# Patient Record
Sex: Male | Born: 1985 | Race: White | Hispanic: No | Marital: Single
Health system: Southern US, Community
[De-identification: ages and names within clinical notes are randomized; demographics above are authoritative.]

---

## 2013-02-28 ENCOUNTER — Emergency Department: Payer: Self-pay

## 2013-02-28 LAB — CBC
HGB: 16.2 g/dL (ref 13.0–18.0)
MCH: 32.1 pg (ref 26.0–34.0)
MCHC: 35 g/dL (ref 32.0–36.0)
MCV: 92 fL (ref 80–100)

## 2013-02-28 LAB — URINALYSIS, COMPLETE
Bilirubin,UR: NEGATIVE
Blood: NEGATIVE
Ketone: NEGATIVE
Nitrite: NEGATIVE
Protein: NEGATIVE
Specific Gravity: 1.024 (ref 1.003–1.030)
Squamous Epithelial: <1
WBC UR: 25 /HPF (ref 0–5)

## 2013-02-28 LAB — DRUG SCREEN, URINE
Amphetamines, Ur Screen: NEGATIVE (ref ?–1000)
Barbiturates, Ur Screen: NEGATIVE (ref ?–200)
Cannabinoid 50 Ng, Ur ~~LOC~~: POSITIVE (ref ?–50)
Cocaine Metabolite,Ur ~~LOC~~: POSITIVE (ref ?–300)
Methadone, Ur Screen: NEGATIVE (ref ?–300)
Opiate, Ur Screen: NEGATIVE (ref ?–300)
Phencyclidine (PCP) Ur S: NEGATIVE (ref ?–25)

## 2013-02-28 LAB — COMPREHENSIVE METABOLIC PANEL
Alkaline Phosphatase: 80 U/L (ref 50–136)
Bilirubin,Total: 0.7 mg/dL (ref 0.2–1.0)
Calcium, Total: 9.3 mg/dL (ref 8.5–10.1)
Chloride: 105 mmol/L (ref 98–107)
Creatinine: 1.12 mg/dL (ref 0.60–1.30)
EGFR (African American): >60
Glucose: 130 mg/dL — ABNORMAL HIGH (ref 65–99)
Potassium: 3.4 mmol/L — ABNORMAL LOW (ref 3.5–5.1)
SGOT(AST): 29 U/L (ref 15–37)
Sodium: 140 mmol/L (ref 136–145)

## 2013-02-28 LAB — ETHANOL: Ethanol: <3 mg/dL

## 2013-03-03 ENCOUNTER — Emergency Department: Payer: Self-pay | Admitting: Emergency Medicine

## 2013-03-11 ENCOUNTER — Emergency Department: Payer: Self-pay | Admitting: Emergency Medicine

## 2014-05-15 ENCOUNTER — Emergency Department: Payer: Self-pay | Admitting: Emergency Medicine

## 2014-05-15 LAB — COMPREHENSIVE METABOLIC PANEL
ALK PHOS: 83 U/L
ALT: 20 U/L
ANION GAP: 12 (ref 7–16)
Albumin: 3.9 g/dL (ref 3.4–5.0)
BUN: 5 mg/dL — ABNORMAL LOW (ref 7–18)
Bilirubin,Total: 0.5 mg/dL (ref 0.2–1.0)
Calcium, Total: 8.9 mg/dL (ref 8.5–10.1)
Chloride: 103 mmol/L (ref 98–107)
Co2: 24 mmol/L (ref 21–32)
Creatinine: 0.88 mg/dL (ref 0.60–1.30)
EGFR (African American): >60
EGFR (Non-African Amer.): >60
GLUCOSE: 96 mg/dL (ref 65–99)
OSMOLALITY: 275 (ref 275–301)
POTASSIUM: 3.1 mmol/L — AB (ref 3.5–5.1)
SGOT(AST): 26 U/L (ref 15–37)
SODIUM: 139 mmol/L (ref 136–145)
TOTAL PROTEIN: 7.6 g/dL (ref 6.4–8.2)

## 2014-05-15 LAB — URINALYSIS, COMPLETE
BLOOD: NEGATIVE
Bacteria: NONE SEEN
Bilirubin,UR: NEGATIVE
Glucose,UR: NEGATIVE mg/dL (ref 0–75)
Ketone: NEGATIVE
Nitrite: NEGATIVE
PH: 5 (ref 4.5–8.0)
Protein: 30
Specific Gravity: 1.024 (ref 1.003–1.030)
WBC UR: 30 /HPF (ref 0–5)

## 2014-05-15 LAB — LIPASE, BLOOD: Lipase: 70 U/L — ABNORMAL LOW (ref 73–393)

## 2014-05-15 LAB — CBC WITH DIFFERENTIAL/PLATELET
Basophil #: 0 10*3/uL (ref 0.0–0.1)
Basophil %: 0.4 %
Eosinophil #: 0.3 10*3/uL (ref 0.0–0.7)
Eosinophil %: 3 %
HCT: 50 % (ref 40.0–52.0)
HGB: 17.2 g/dL (ref 13.0–18.0)
Lymphocyte #: 2.1 10*3/uL (ref 1.0–3.6)
Lymphocyte %: 21 %
MCH: 32.6 pg (ref 26.0–34.0)
MCHC: 34.3 g/dL (ref 32.0–36.0)
MCV: 95 fL (ref 80–100)
Monocyte #: 0.8 x10 3/mm (ref 0.2–1.0)
Monocyte %: 8.3 %
NEUTROS ABS: 6.8 10*3/uL — AB (ref 1.4–6.5)
NEUTROS PCT: 67.3 %
Platelet: 255 10*3/uL (ref 150–440)
RBC: 5.27 10*6/uL (ref 4.40–5.90)
RDW: 13.1 % (ref 11.5–14.5)
WBC: 10.2 10*3/uL (ref 3.8–10.6)

## 2014-05-17 LAB — URINE CULTURE

## 2014-12-28 NOTE — Consult Note (Signed)
PATIENT NAME:  Brandon Fritz, Brandon Fritz MR#:  [id number] DATE OF BIRTH:  29-Jul-1986  DATE OF CONSULTATION:  03/01/2013  CONSULTING PHYSICIAN:  Izola Price. Jaclynn Major, MD  CHIEF COMPLAINT:  "I'm depressed. I work at [employer] on 3rd shift and find it hard to sleep. I sometimes see blobs and hear ringing in my ears.  When I do sleep, I sometimes have nightmares because my girlfriend broke up with me."  HISTORY OF PRESENT ILLNESS:  This is a young 29 year old male with a family history of depression.  He reports that he also has a history of depression and alcohol dependence with 1 hospitalization at [facility] at the age of 29 for overdose on alcohol and drugs.  He reports that he saw a psychiatrist for a little while after that, but then stopped.  He goes on to say that he works at [employer], 3rd shift and he works 3 to 4 weeks in a row with no break and gets about 1 day off  a month.  He goes on to say he is exhausted, he cannot sleep.  He sometimes sees clouds and shadows and hears music.    He says that his family is full of depression and some have tried to kill themselves, but none have succeeded.    On interview, he is cooperative.  He reports that he cannot sleep.  He has had appetite changes also.  He reports that since he and girlfriend broke up, he has had loss of interest.  His speech is within normal limits.  There are no hallucinations, delusions or paranoia or catatonia.  There are no negative symptoms noted.  He has some slight disorganization.  He reports that he does not have anxiety.  His behavior is somewhat apathetic.  He is alert and is attentive.  He is able to concentrate on the conversation and answer appropriately.  He denies psychosis, disorientation, amnesia or behavior disturbances.    PAST PSYCHIATRIC HISTORY:    FIRST TREATMENT:  29 years old at [facility] for "depression and alcohol poisoning."    PREVIOUS HOSPITALIZATIONS:  See above.    HISTORY OF SUICIDE ATTEMPTS:  He denies that he is  suicidal.  He also reports that he is not homicidal.    CURRENT OUTPATIENT TREATMENT:  None.    SUBSTANCE ABUSE TREATMENT HISTORY:  None.  It should be noted; however, that he reported he did not use drugs or alcohol to me; however, his urine drug screen was positive for cocaine and marijuana.  When I asked him about this, he reported that he used some cocaine recently, but prior to then, had not used it for 6 months.  He then went on to say that he does use cannabis 3 to 5 times a week.  At that time, he smokes several joints.  He denies alcohol.  He denies all other substances.    PAST MEDICAL AND SURGICAL HISTORY:  None.    MENTAL ILLNESS IN THE FAMILY:  He reports that many of his relatives suffer from depression and substance abuse.    SOCIAL HISTORY:  He is a 29 year old single white male who works at [employer].  He and his girlfriend recently broke up.  He denies any history of physical or sexual abuse.  He has no legal history.    MENTAL STATUS EXAMINATION:  His speech is nonfluent, rather monotone and soft.  His mood is within normal limits.  He appears sometimes irritated by what has happened between he and his girlfriend.  Thought processes are linear, logical and goal-oriented.  Thought content:  He denies any suicidal ideation or homicidal ideation.  When I ask him about reporting earlier that he was suicidal, he said that was because [facility] said he was suicidal because of the alcohol poisoning, but he was not.  He denies any auditory, visual hallucinations.  He had no delusions reported or noted.  There is no depersonalization, derealization, elusions, ideas of reference or command auditory hallucinations.  He is oriented x 4.  He is alert and awake.  His concentration seems to be fair.  His memory is intact generally and his fund of knowledge seems to be fair.  His language is fair.  His judgement is fair.  Insight is poor to fair.   SUICIDE RISK LEVEL:  I do not believe there is a risk.     REVIEW OF SYSTEMS:  Noncontributory.    MEDICATIONS:  None.   AXIS I:  Cocaine abuse, marijuana abuse.  AXIS II:  Personality disorder, not otherwise specified.   AXIS III:  None.   AXIS IV:  Problems with the primary support group.    TREATMENT PLAN:  We will make appointment for Brandon Fritz and discharge him from the ED [facility].  He does not wish hospitalization wants medication and help with drugs.    ____________________________ Izola PriceFrances C. Jaclynn MajorGreason, MD fcg:cc D: 03/01/2013 16:52:27 ET T: 03/01/2013 17:43:19 ET JOB#: [id number]  cc: Izola PriceFrances C. Jaclynn MajorGreason, MD, <Dictator> Maryan PulsFRANCES C Jacqulynn Shappell MD ELECTRONICALLY SIGNED 03/09/2013 12:20

## 2015-06-16 IMAGING — CT CT ABD-PELV W/O CM
1 of 4 series · 5 of 46 positions shown, 10 images · non-contrast
Comparison: None.

CLINICAL DATA: Hematemesis. Right lateral pain radiating to the
right upper quadrant.

EXAM:
CT ABDOMEN AND PELVIS WITHOUT CONTRAST
TECHNIQUE: Multidetector CT imaging of the abdomen and pelvis was performed
following the standard protocol without IV contrast.

[Series 4: lung windows · axial · 0.68mm/px · z∈[-182,-108]mm · 5 of 23 slices shown, 10 images]
[im 4/23  soft-tissue]
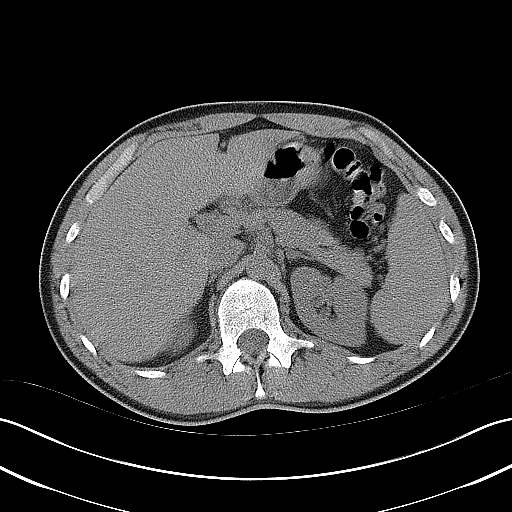
[im 4/23  bone]
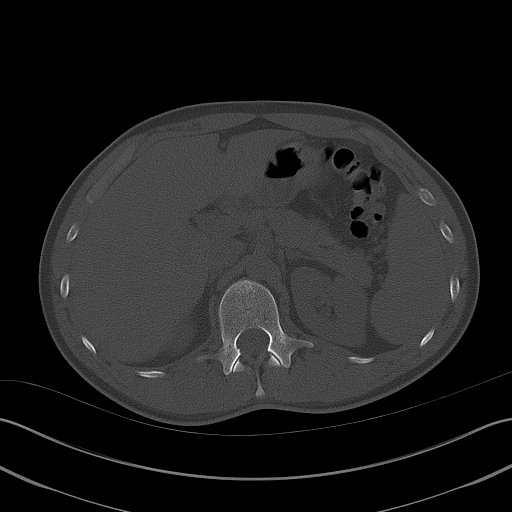
[im 8/23  soft-tissue]
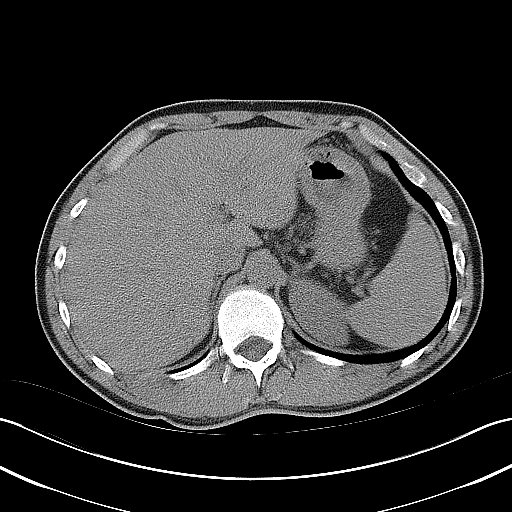
[im 8/23  lung]
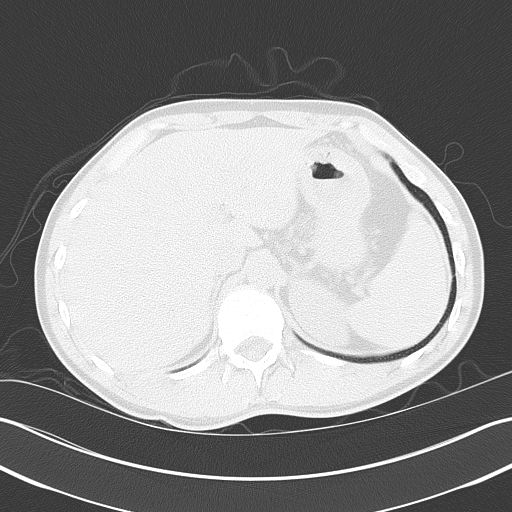
[im 12/23  soft-tissue]
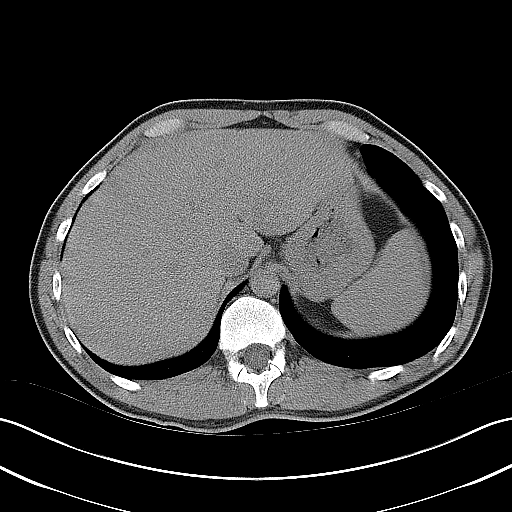
[im 12/23  lung]
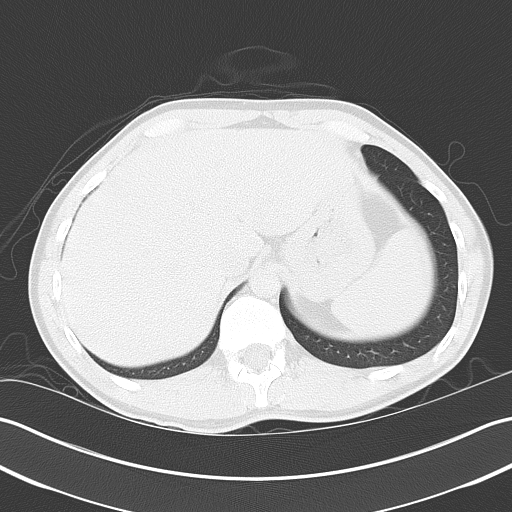
[im 15/23  soft-tissue]
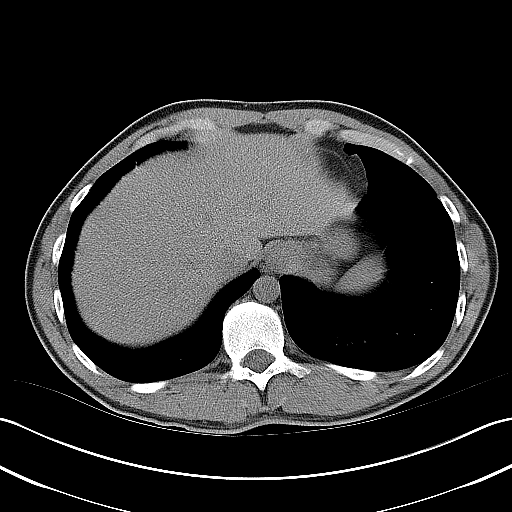
[im 15/23  lung]
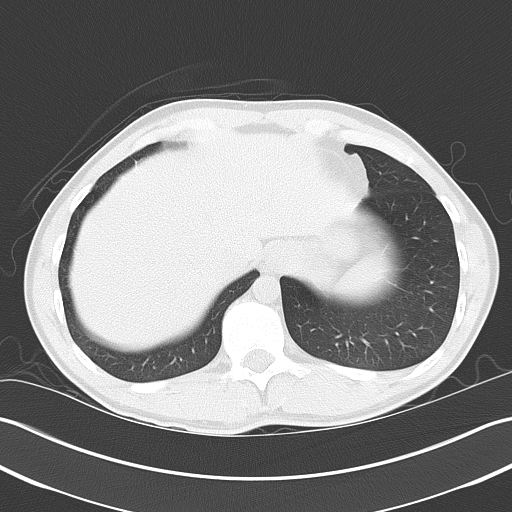
[im 19/23  soft-tissue]
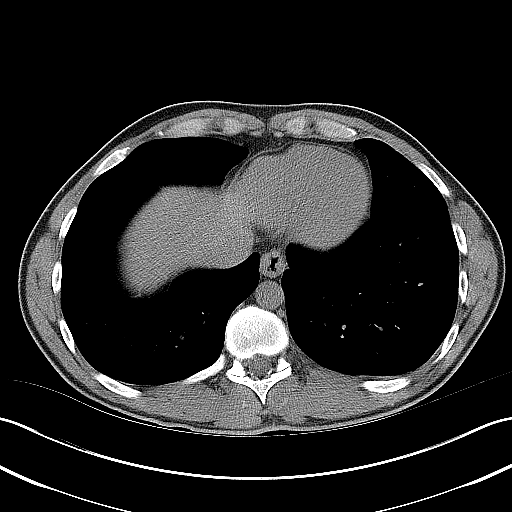
[im 19/23  lung]
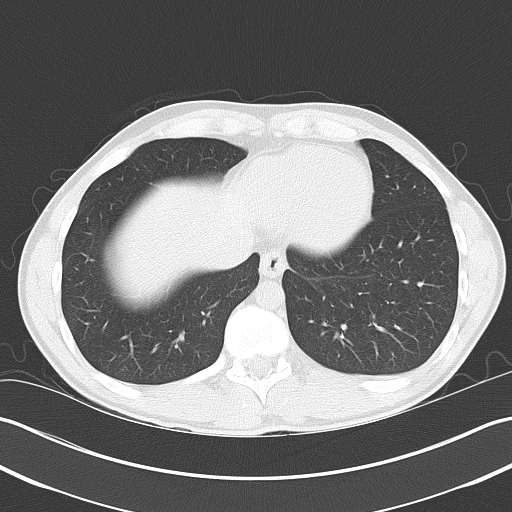

[5 of 46 positions shown; findings below may reference images not displayed]

FINDINGS: Liver/biliary/pancreas/spleen: Suspected mild gallbladder wall
thickening.

Urinary tract/Adrenals: Unremarkable

Bowel/Mesentery/Appendix (if present): Unremarkable

Lymph Nodes: Unremarkable

Reproductive: Unremarkable

Musculoskeletal: Partially fused sacroiliac joints bilaterally.
Right facet arthropathy with mild chronic fragmentation of the
superior articular facet at S1. The facet spurring at this level
causes mild right foraminal stenosis. Mild disc bulge at L4-5
without discrete impingement at that level. Schmorl's node along the
inferior endplate of L1.

Other: Unremarkable
IMPRESSION: 1. Suspected mild gallbladder wall thickening. Correlate clinically
in assessing for acute cholecystitis. If further imaging workup is
warranted, nuclear medicine hepatobiliary scan or ultrasound might
be helpful.
2. Partially fused sacroiliac joints likely from chronic
sacroiliitis.
3. Mild right foraminal stenosis at L5-S1 due to facet arthropathy.
